# Patient Record
Sex: Male | Born: 1990 | Race: White | Hispanic: No | Marital: Single | Smoking: Former smoker
Health system: Southern US, Community
[De-identification: ages and names within clinical notes are randomized; demographics above are authoritative.]

## PROBLEM LIST (undated history)

## (undated) DIAGNOSIS — J45909 Unspecified asthma, uncomplicated: Secondary | ICD-10-CM

## (undated) DIAGNOSIS — L309 Dermatitis, unspecified: Secondary | ICD-10-CM

---
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
## 2011-03-06 ENCOUNTER — Emergency Department (HOSPITAL_COMMUNITY)
Admission: EM | Admit: 2011-03-06 | Discharge: 2011-03-06 | Disposition: A | Discharge status: Home / Self Care | Payer: 59 | Attending: Emergency Medicine | Admitting: Emergency Medicine

## 2011-03-06 ENCOUNTER — Encounter: Payer: Self-pay | Admitting: *Deleted

## 2011-03-06 DIAGNOSIS — J45901 Unspecified asthma with (acute) exacerbation: Secondary | ICD-10-CM

## 2011-03-06 DIAGNOSIS — J45909 Unspecified asthma, uncomplicated: Secondary | ICD-10-CM | POA: Insufficient documentation

## 2011-03-06 DIAGNOSIS — R0602 Shortness of breath: Secondary | ICD-10-CM | POA: Insufficient documentation

## 2011-03-06 HISTORY — DX: Dermatitis, unspecified: L30.9

## 2011-03-06 MED ORDER — ALBUTEROL SULFATE (5 MG/ML) 0.5% IN NEBU
5.0000 mg | INHALATION_SOLUTION | Freq: Once | RESPIRATORY_TRACT | Status: AC
  Administered 2011-03-06: 5 mg via RESPIRATORY_TRACT
  Filled 2011-03-06: qty 1

## 2011-03-06 MED ORDER — ALBUTEROL SULFATE HFA 108 (90 BASE) MCG/ACT IN AERS
1.0000 | INHALATION_SPRAY | Freq: Once | RESPIRATORY_TRACT | Status: AC
  Administered 2011-03-06: 1 via RESPIRATORY_TRACT
  Filled 2011-03-06: qty 6.7

## 2011-03-06 MED ORDER — IPRATROPIUM BROMIDE 0.02 % IN SOLN
0.5000 mg | Freq: Once | RESPIRATORY_TRACT | Status: AC
  Administered 2011-03-06: 0.5 mg via RESPIRATORY_TRACT
  Filled 2011-03-06: qty 2.5

## 2011-03-06 MED ORDER — FLUTICASONE PROPIONATE HFA 44 MCG/ACT IN AERO
2.0000 | INHALATION_SPRAY | Freq: Once | RESPIRATORY_TRACT | Status: AC
  Administered 2011-03-06: 2 via RESPIRATORY_TRACT
  Filled 2011-03-06: qty 10.6

## 2011-03-06 NOTE — ED Provider Notes (Signed)
History     CSN: [id number] Arrival date & time: 03/06/2011  3:14 AM   First MD Initiated Contact with Patient 03/06/11 0448      Chief Complaint  Patient presents with  . Asthma  . Wheezing  . Shortness of Breath    (Consider location/radiation/quality/duration/timing/severity/associated sxs/prior treatment) HPI Comments: Patient reports that shortly before arrival to [facility] he was woken with a sudden asthma attack. He denies any recent fevers, chills, URI symptoms. Vision is had an asthma history since he was about 20 years old. Patient reports that he has run low on his albuterol inhaler and is likelyoutL. He has not had a chronic care physician. Patient reports that he used to be on maintenance medication such as an inhaled steroid when he was a child but has not been on one in many years. Patient reports he quit smoking several months ago. Patient was audibly wheezing according to the patient's nurse in triage and was given a albuterol nebulizer treatment by protocol. Patient reports that he feels completely resolved at this time. He reports he never had any chest pain, abdominal pain, nausea or vomiting.  Patient is a 20 y.o. male presenting with asthma, wheezing, and shortness of breath. The history is provided by the patient.  Asthma Associated symptoms include shortness of breath.  Wheezing  Associated symptoms include shortness of breath and wheezing. Pertinent negatives include no rhinorrhea and no cough. His past medical history is significant for asthma.  Shortness of Breath  Associated symptoms include shortness of breath and wheezing. Pertinent negatives include no rhinorrhea and no cough. His past medical history is significant for asthma.    Past Medical History  Diagnosis Date  . Asthma   . Eczema     History reviewed. No pertinent past surgical history.  History reviewed. No pertinent family history.  History  Substance Use Topics  . Smoking  status: Former Smoker    Quit date: 12/04/2010  . Smokeless tobacco: Not on file  . Alcohol Use: 3.6 oz/week    6 Cans of beer per week     daily      Review of Systems  Constitutional: Negative.   HENT: Negative for congestion, rhinorrhea and postnasal drip.   Respiratory: Positive for shortness of breath and wheezing. Negative for cough and chest tightness.   Musculoskeletal: Negative.     Allergies  Review of patient's allergies indicates no known allergies.  Home Medications   Current Outpatient Rx  Name Route Sig Dispense Refill  . ALBUTEROL SULFATE HFA 108 (90 BASE) MCG/ACT IN AERS Inhalation Inhale 2 puffs into the lungs every 6 (six) hours as needed.        BP 104/67  Pulse 74  Temp(Src) 97.9 F (36.6 C) (Oral)  Resp 18  SpO2 97%  Physical Exam  Nursing note and vitals reviewed. Constitutional: He is oriented to person, place, and time. He appears well-developed and well-nourished.  HENT:  Head: Normocephalic and atraumatic.  Neck: Normal range of motion. Neck supple.  Cardiovascular: Normal rate and regular rhythm.   Pulmonary/Chest: Effort normal and breath sounds normal. He has no wheezes. He has no rales.  Abdominal: Soft.  Musculoskeletal: Normal range of motion.  Neurological: He is alert and oriented to person, place, and time.  Skin: Skin is warm.    ED Course  Procedures (including critical care time)  Labs Reviewed - No data to display No results found.   1. Asthma attack  MDM  Patient's symptoms are completely resolved. After discussion we have decided to give the patient an albuterol rescue inhaler along with a spacer. I also decided to start him on an inhaled steroid, Flovent. I do not feel that he would benefit from oral steroids given his attack was relatively mild having resolved with one treatment. Patient is a reliable patient. He does know that I recommend that he obtain a primary care physician for routine continued care of  asthma. His room air sats currently are 97% which is normal. His other vital signs are completely normal.        Gabriel Thornton. Oletta Lamas, MD 03/06/11 [phone]

## 2011-03-06 NOTE — Discharge Instructions (Signed)
Asthma Attack Prevention HOW CAN ASTHMA BE PREVENTED? Currently, there is no way to prevent asthma from starting. However, you can take steps to control the disease and prevent its symptoms after you have been diagnosed. Learn about your asthma and how to control it. Take an active role to control your asthma by working with your caregiver to create and follow an asthma action plan. An asthma action plan guides you in taking your medicines properly, avoiding factors that make your asthma worse, tracking your level of asthma control, responding to worsening asthma, and seeking emergency care when needed. To track your asthma, keep records of your symptoms, check your peak flow number using a peak flow meter (handheld device that shows how well air moves out of your lungs), and get regular asthma checkups.  Other ways to prevent asthma attacks include:  Use medicines as your caregiver directs.   Identify and avoid things that make your asthma worse (as much as you can).   Keep track of your asthma symptoms and level of control.   Get regular checkups for your asthma.   With your caregiver, write a detailed plan for taking medicines and managing an asthma attack. Then be sure to follow your action plan. Asthma is an ongoing condition that needs regular monitoring and treatment.   Identify and avoid asthma triggers. A number of outdoor allergens and irritants (pollen, mold, cold air, air pollution) can trigger asthma attacks. Find out what causes or makes your asthma worse, and take steps to avoid those triggers (see below).   Monitor your breathing. Learn to recognize warning signs of an attack, such as slight coughing, wheezing or shortness of breath. However, your lung function may already decrease before you notice any signs or symptoms, so regularly measure and record your peak airflow with a home peak flow meter.   Identify and treat attacks early. If you act quickly, you're less likely to have  a severe attack. You will also need less medicine to control your symptoms. When your peak flow measurements decrease and alert you to an upcoming attack, take your medicine as instructed, and immediately stop any activity that may have triggered the attack. If your symptoms do not improve, get medical help.   Pay attention to increasing quick-relief inhaler use. If you find yourself relying on your quick-relief inhaler (such as albuterol), your asthma is not under control. See your caregiver about adjusting your treatment.  IDENTIFY AND CONTROL FACTORS THAT MAKE YOUR ASTHMA WORSE A number of common things can set off or make your asthma symptoms worse (asthma triggers). Keep track of your asthma symptoms for several weeks, detailing all the environmental and emotional factors that are linked with your asthma. When you have an asthma attack, go back to your asthma diary to see which factor, or combination of factors, might have contributed to it. Once you know what these factors are, you can take steps to control many of them.  Allergies: If you have allergies and asthma, it is important to take asthma prevention steps at home. Asthma attacks (worsening of asthma symptoms) can be triggered by allergies, which can cause temporary increased inflammation of your airways. Minimizing contact with the substance to which you are allergic will help prevent an asthma attack. Animal Dander:   Some people are allergic to the flakes of skin or dried saliva from animals with fur or feathers. Keep these pets out of your home.   If you can't keep a pet outdoors, keep the   pet out of your bedroom and other sleeping areas at all times, and keep the door closed.   Remove carpets and furniture covered with cloth from your home. If that is not possible, keep the pet away from fabric-covered furniture and carpets.  Dust Mites:  Many people with asthma are allergic to dust mites. Dust mites are tiny bugs that are found in  every home, in mattresses, pillows, carpets, fabric-covered furniture, bedcovers, clothes, stuffed toys, fabric, and other fabric-covered items.   Cover your mattress in a special dust-proof cover.   Cover your pillow in a special dust-proof cover, or wash the pillow each week in hot water. Water must be hotter than 130 F to kill dust mites. Cold or warm water used with detergent and bleach can also be effective.   Wash the sheets and blankets on your bed each week in hot water.   Try not to sleep or lie on cloth-covered cushions.   Call ahead when traveling and ask for a smoke-free hotel room. Bring your own bedding and pillows, in case the hotel only supplies feather pillows and down comforters, which may contain dust mites and cause asthma symptoms.   Remove carpets from your bedroom and those laid on concrete, if you can.   Keep stuffed toys out of the bed, or wash the toys weekly in hot water or cooler water with detergent and bleach.  Cockroaches:  Many people with asthma are allergic to the droppings and remains of cockroaches.   Keep food and garbage in closed containers. Never leave food out.   Use poison baits, traps, powders, gels, or paste (for example, boric acid).   If a spray is used to kill cockroaches, stay out of the room until the odor goes away.  Indoor Mold:  Fix leaky faucets, pipes, or other sources of water that have mold around them.   Clean moldy surfaces with a cleaner that has bleach in it.  Pollen and Outdoor Mold:  When pollen or mold spore counts are high, try to keep your windows closed.   Stay indoors with windows closed from late morning to afternoon, if you can. Pollen and some mold spore counts are highest at that time.   Ask your caregiver whether you need to take or increase anti-inflammatory medicine before your allergy season starts.  Irritants:   Tobacco smoke is an irritant. If you smoke, ask your caregiver how you can quit. Ask family  members to quit smoking, too. Do not allow smoking in your home or car.   If possible, do not use a wood-burning stove, kerosene heater, or fireplace. Minimize exposure to all sources of smoke, including incense, candles, fires, and fireworks.   Try to stay away from strong odors and sprays, such as perfume, talcum powder, hair spray, and paints.   Decrease humidity in your home and use an indoor air cleaning device. Reduce indoor humidity to below 60 percent. Dehumidifiers or central air conditioners can do this.   Try to have someone else vacuum for you once or twice a week, if you can. Stay out of rooms while they are being vacuumed and for a short while afterward.   If you vacuum, use a dust mask from a hardware store, a double-layered or microfilter vacuum cleaner bag, or a vacuum cleaner with a HEPA filter.   Sulfites in foods and beverages can be irritants. Do not drink beer or wine, or eat dried fruit, processed potatoes, or shrimp if they cause asthma   symptoms.   Cold air can trigger an asthma attack. Cover your nose and mouth with a scarf on cold or windy days.   Several health conditions can make asthma more difficult to manage, including runny nose, sinus infections, reflux disease, psychological stress, and sleep apnea. Your caregiver will treat these conditions, as well.   Avoid close contact with people who have a cold or the flu, since your asthma symptoms may get worse if you catch the infection from them. Wash your hands thoroughly after touching items that may have been handled by people with a respiratory infection.   Get a flu shot every year to protect against the flu virus, which often makes asthma worse for days or weeks. Also get a pneumonia shot once every five to 10 years.  Drugs:  Aspirin and other painkillers can cause asthma attacks. 10% to 20% of people with asthma have sensitivity to aspirin or a group of painkillers called non-steroidal anti-inflammatory drugs  (NSAIDS), such as ibuprofen and naproxen. These drugs are used to treat pain and reduce fevers. Asthma attacks caused by any of these medicines can be severe and even fatal. These drugs must be avoided in people who have known aspirin sensitive asthma. Products with acetaminophen are considered safe for people who have asthma. It is important that people with aspirin sensitivity read labels of all over-the-counter drugs used to treat pain, colds, coughs, and fever.   Beta blockers and ACE inhibitors are other drugs which you should discuss with your caregiver, in relation to your asthma.  ALLERGY SKIN TESTING  Ask your asthma caregiver about allergy skin testing or blood testing (RAST test) to identify the allergens to which you are sensitive. If you are found to have allergies, allergy shots (immunotherapy) for asthma may help prevent future allergies and asthma. With allergy shots, small doses of allergens (substances to which you are allergic) are injected under your skin on a regular schedule. Over a period of time, your body may become used to the allergen and less responsive with asthma symptoms. You can also take measures to minimize your exposure to those allergens. EXERCISE  If you have exercise-induced asthma, or are planning vigorous exercise, or exercise in cold, humid, or dry environments, prevent exercise-induced asthma by following your caregiver's advice regarding asthma treatment before exercising. Document Released: 03/22/2009 Document Revised: 12/14/2010 Document Reviewed: 03/22/2009 [employer] Patient Information 2012 [employer]. 

## 2011-03-06 NOTE — ED Notes (Signed)
Pt presents w/ audible wheezing, hx of asthma, woke x 1.5 hrs ago w/ shortness of breath.

## 2012-03-31 ENCOUNTER — Emergency Department (HOSPITAL_COMMUNITY)
Admission: EM | Admit: 2012-03-31 | Discharge: 2012-03-31 | Disposition: A | Discharge status: Home / Self Care | Payer: 59 | Attending: Emergency Medicine | Admitting: Emergency Medicine

## 2012-03-31 ENCOUNTER — Emergency Department (HOSPITAL_COMMUNITY): Payer: 59

## 2012-03-31 ENCOUNTER — Encounter (HOSPITAL_COMMUNITY): Payer: Self-pay | Admitting: *Deleted

## 2012-03-31 DIAGNOSIS — R0789 Other chest pain: Secondary | ICD-10-CM | POA: Insufficient documentation

## 2012-03-31 DIAGNOSIS — J45901 Unspecified asthma with (acute) exacerbation: Secondary | ICD-10-CM | POA: Insufficient documentation

## 2012-03-31 DIAGNOSIS — Z79899 Other long term (current) drug therapy: Secondary | ICD-10-CM | POA: Insufficient documentation

## 2012-03-31 DIAGNOSIS — J45909 Unspecified asthma, uncomplicated: Secondary | ICD-10-CM

## 2012-03-31 DIAGNOSIS — J40 Bronchitis, not specified as acute or chronic: Secondary | ICD-10-CM

## 2012-03-31 DIAGNOSIS — R0602 Shortness of breath: Secondary | ICD-10-CM | POA: Insufficient documentation

## 2012-03-31 DIAGNOSIS — R0609 Other forms of dyspnea: Secondary | ICD-10-CM | POA: Insufficient documentation

## 2012-03-31 DIAGNOSIS — R093 Abnormal sputum: Secondary | ICD-10-CM | POA: Insufficient documentation

## 2012-03-31 DIAGNOSIS — J209 Acute bronchitis, unspecified: Secondary | ICD-10-CM | POA: Insufficient documentation

## 2012-03-31 DIAGNOSIS — R0989 Other specified symptoms and signs involving the circulatory and respiratory systems: Secondary | ICD-10-CM | POA: Insufficient documentation

## 2012-03-31 DIAGNOSIS — Z87891 Personal history of nicotine dependence: Secondary | ICD-10-CM | POA: Insufficient documentation

## 2012-03-31 LAB — BASIC METABOLIC PANEL
BUN: 14 mg/dL (ref 6–23)
CO2: 22 mEq/L (ref 19–32)
Calcium: 8.9 mg/dL (ref 8.4–10.5)
Chloride: 105 mEq/L (ref 96–112)
Creatinine, Ser: 0.71 mg/dL (ref 0.50–1.35)

## 2012-03-31 LAB — CBC WITH DIFFERENTIAL/PLATELET
Eosinophils Relative: 2 % (ref 0–5)
HCT: 41 % (ref 39.0–52.0)
Hemoglobin: 14.2 g/dL (ref 13.0–17.0)
Lymphocytes Relative: 10 % — ABNORMAL LOW (ref 12–46)
MCHC: 34.6 g/dL (ref 30.0–36.0)
MCV: 84.5 fL (ref 78.0–100.0)
Monocytes Absolute: 0.1 10*3/uL (ref 0.1–1.0)
Monocytes Relative: 1 % — ABNORMAL LOW (ref 3–12)
Neutro Abs: 5.5 10*3/uL (ref 1.7–7.7)
WBC: 6.2 10*3/uL (ref 4.0–10.5)

## 2012-03-31 MED ORDER — AEROCHAMBER Z-STAT PLUS/MEDIUM MISC: 1.0000 | Freq: Once | Status: DC

## 2012-03-31 MED ORDER — ALBUTEROL SULFATE HFA 108 (90 BASE) MCG/ACT IN AERS
2.0000 | INHALATION_SPRAY | RESPIRATORY_TRACT | Status: DC | PRN
  Administered 2012-03-31: 2 via RESPIRATORY_TRACT
  Filled 2012-03-31: qty 6.7

## 2012-03-31 MED ORDER — PREDNISONE 10 MG PO TABS: 10.0000 mg | ORAL_TABLET | Freq: Once | ORAL | Status: DC

## 2012-03-31 MED ORDER — PREDNISONE 20 MG PO TABS: 60.0000 mg | ORAL_TABLET | Freq: Once | ORAL | Status: DC

## 2012-03-31 MED ORDER — SODIUM CHLORIDE 0.9 % IV SOLN
INTRAVENOUS | Status: DC
  Administered 2012-03-31: 09:00:00 via INTRAVENOUS

## 2012-03-31 MED ORDER — SODIUM CHLORIDE 0.9 % IV BOLUS (SEPSIS)
1000.0000 mL | Freq: Once | INTRAVENOUS | Status: AC
  Administered 2012-03-31: 1000 mL via INTRAVENOUS

## 2012-03-31 MED ORDER — ALBUTEROL SULFATE (5 MG/ML) 0.5% IN NEBU
10.0000 mg | INHALATION_SOLUTION | Freq: Once | RESPIRATORY_TRACT | Status: AC
  Administered 2012-03-31: 10 mg via RESPIRATORY_TRACT

## 2012-03-31 MED ORDER — PREDNISONE 20 MG PO TABS
60.0000 mg | ORAL_TABLET | Freq: Once | ORAL | Status: AC
  Administered 2012-03-31: 60 mg via ORAL
  Filled 2012-03-31: qty 3

## 2012-03-31 MED ORDER — MOMETASONE FURO-FORMOTEROL FUM 200-5 MCG/ACT IN AERO: 2.0000 | INHALATION_SPRAY | Freq: Two times a day (BID) | RESPIRATORY_TRACT | Status: DC

## 2012-03-31 NOTE — ED Notes (Signed)
Patient transported to X-ray 

## 2012-03-31 NOTE — ED Provider Notes (Signed)
History     CSN: [id number]  Arrival date & time 03/31/12  5366   First MD Initiated Contact with Patient 03/31/12 [phone]      Chief Complaint  Patient presents with  . Shortness of Breath  . Asthma  . chest tightness     (Consider location/radiation/quality/duration/timing/severity/associated sxs/prior treatment) HPI Comments: Gabriel Thornton is a 21 y.o. Male who who has had shortness of breath for several weeks. His albuterol inhaler, ran out, last night. He has no regular medical provider. He stopped smoking a couple weeks ago, when the shortness of breath, started. She has cough, productive of green sputum, without blood. No fever, chills, nausea, or vomiting. He is more short of breath with activity, it improves with rest. There are no other modifying factors.  Patient is a 21 y.o. male presenting with shortness of breath and asthma. The history is provided by the patient.  Shortness of Breath  Associated symptoms include shortness of breath. His past medical history is significant for asthma.  Asthma Associated symptoms include shortness of breath.    Past Medical History  Diagnosis Date  . Asthma   . Eczema     History reviewed. No pertinent past surgical history.  History reviewed. No pertinent family history.  History  Substance Use Topics  . Smoking status: Former Smoker -- 1.0 packs/day for 5 years    Types: Cigarettes    Quit date: 12/04/2010  . Smokeless tobacco: Not on file  . Alcohol Use: 3.6 oz/week    6 Cans of beer per week     Comment: daily      Review of Systems  Respiratory: Positive for shortness of breath.   All other systems reviewed and are negative.    Allergies  Review of patient's allergies indicates no known allergies.  Home Medications   Current Outpatient Rx  Name  Route  Sig  Dispense  Refill  . ALBUTEROL SULFATE HFA 108 (90 BASE) MCG/ACT IN AERS   Inhalation   Inhale 2 puffs into the lungs every 6 (six) hours as needed.            Marland Kitchen SINUS PO   Oral   Take 1 tablet by mouth daily as needed. Cold symptoms         . MOMETASONE FURO-FORMOTEROL FUM 200-5 MCG/ACT IN AERO   Inhalation   Inhale 2 puffs into the lungs 2 (two) times daily.   8.8 g   0   . PREDNISONE 10 MG PO TABS   Oral   Take 1 tablet (10 mg total) by mouth once. Take q day 6,5,4,3,2,1   21 tablet   0     BP 117/74  Pulse 111  Temp 98.6 F (37 C) (Oral)  Resp 18  SpO2 93%  Physical Exam  Nursing note and vitals reviewed. Constitutional: He is oriented to person, place, and time. He appears well-developed and well-nourished.  HENT:  Head: Normocephalic and atraumatic.  Right Ear: External ear normal.  Left Ear: External ear normal.  Eyes: Conjunctivae normal and EOM are normal. Pupils are equal, round, and reactive to light.  Neck: Normal range of motion and phonation normal. Neck supple.  Cardiovascular: Normal rate, regular rhythm, normal heart sounds and intact distal pulses.   Pulmonary/Chest: He is in respiratory distress (Moderate). He has wheezes. He has no rales. He exhibits no tenderness and no bony tenderness.       Increased work of breathing, tripod position, intercostal retractions. Decreased air  movement, and generalized wheezing.  Abdominal: Soft. Normal appearance. There is no tenderness.  Musculoskeletal: Normal range of motion.  Neurological: He is alert and oriented to person, place, and time. He has normal strength. No cranial nerve deficit or sensory deficit. He exhibits normal muscle tone. Coordination normal.  Skin: Skin is warm, dry and intact.  Psychiatric: He has a normal mood and affect. His behavior is normal. Judgment and thought content normal.    ED Course  Procedures (including critical care time)  Initial treatment: Continuous albuterol nebulizer, and prednisone, orally. IV fluids, given.  Reevaluation: He feels somewhat better. Lungs have improved air movement. Now, more evident, wheezing.  Heart rate 117. Repeat continuous nebulizer ordered.  Reevaluation: 11:30- he desaturated, on room air; he required oxygen by nasal cannula at 2 L, to improve the oxygen saturation to 93%.   At D/C he was able to ambulate with normal O2 Sat., and mild tachycardia. At rest, no respiratory distress. He felt improved and comfortable with the D/C plan.   Date: 02/02/2012  Rate: 118  Rhythm: sinus tachycardia  QRS Axis: normal  PR and QT Intervals: normal  ST/T Wave abnormalities: nonspecific ST changes  PR and QRS Conduction Disutrbances:none  Narrative Interpretation: possible biatrial enlargement  Old EKG Reviewed: none available  CRITICAL CARE Performed by: Mancel Bale L   Total critical care time: 50 minutes  Critical care time was exclusive of separately billable procedures and treating other patients.  Critical care was necessary to treat or prevent imminent or life-threatening deterioration.  Critical care was time spent personally by me on the following activities: development of treatment plan with patient and/or surrogate as well as nursing, discussions with consultants, evaluation of patient's response to treatment, examination of patient, obtaining history from patient or surrogate, ordering and performing treatments and interventions, ordering and review of laboratory studies, ordering and review of radiographic studies, pulse oximetry and re-evaluation of patient's condition.  Labs Reviewed  CBC WITH DIFFERENTIAL - Abnormal; Notable for the following:    Neutrophils Relative 88 (*)     Lymphocytes Relative 10 (*)     Lymphs Abs 0.6 (*)     Monocytes Relative 1 (*)     All other components within normal limits  BASIC METABOLIC PANEL - Abnormal; Notable for the following:    Glucose, Bld 105 (*)     All other components within normal limits  LAB REPORT - SCANNED   Dg Chest 2 View  03/31/2012  *RADIOLOGY REPORT*  Clinical Data: Short of breath, chest tightness   CHEST - 2 VIEW  Comparison: None.  Findings: Normal mediastinum and cardiac silhouette.  Normal pulmonary  vasculature.  No evidence of effusion, infiltrate, or pneumothorax.  No acute bony abnormality.  IMPRESSION: Normal chest radiograph   Original Report Authenticated By: Genevive Bi, M.D.      1. Asthma   2. Bronchitis       MDM  Asthma with under-treatment at home, complicated by tobacco abuse (recently stopped). Improved with ED treatment. Stable for continued treatment at home. Doubt metabolic instability, serious bacterial infection or impending vascular collapse; the patient is stable for discharge.   Plan: Home Medications- Albuterol, Prednisone and Dulera; Home Treatments- avoid tobacco; Recommended follow up- PCP of choice asap      Flint Melter, MD 04/01/12 1316

## 2012-03-31 NOTE — ED Notes (Signed)
Pt more comfortable. CN helping pt to breathe easier. Pt reports "feeling much better".

## 2012-03-31 NOTE — ED Notes (Signed)
Respiratory called

## 2012-03-31 NOTE — ED Notes (Addendum)
Pt ambulated in the hall O2 sats began at 95% and heart rate 119. During ambulation O2 sats increased to 97% and heart rate of 136. At the end of ambulation the patient began coughing and having increased shortness of breath but sustained 97% oxygen saturation.

## 2012-03-31 NOTE — ED Notes (Addendum)
Pt reports hx of asthma, pt has had upper respiratory infection x1 week.productive cough, "little chunks of blood and dark colored phlem"  In last week pt has used up Ventilin, inhaler (30 day supply) in last week because of asthma. Pt reports this flare up started 0300. At present pt has inspiratory and expiratory wheezes heard in all fields. Pt able to speak in full sentences. In tripod position. 90% room air. 94% 2L [place]. Pt also reports chest tightness started at same time as asthma attack. Pain 7/10. Headache present as well.

## 2012-03-31 NOTE — ED Notes (Addendum)
Pt desat to 88% while sleeping. Placed pt on 2L, notified Effie Shy, EDP. Pt now 93%

## 2012-03-31 NOTE — ED Notes (Signed)
Respiratory called for inhaler, chamber and pt education

## 2012-12-11 ENCOUNTER — Encounter (HOSPITAL_COMMUNITY): Payer: Self-pay | Admitting: *Deleted

## 2012-12-11 ENCOUNTER — Emergency Department (HOSPITAL_COMMUNITY)
Admission: EM | Admit: 2012-12-11 | Discharge: 2012-12-11 | Disposition: A | Discharge status: Home / Self Care | Payer: Self-pay | Attending: Emergency Medicine | Admitting: Emergency Medicine

## 2012-12-11 DIAGNOSIS — Z87891 Personal history of nicotine dependence: Secondary | ICD-10-CM | POA: Insufficient documentation

## 2012-12-11 DIAGNOSIS — J45901 Unspecified asthma with (acute) exacerbation: Secondary | ICD-10-CM | POA: Insufficient documentation

## 2012-12-11 HISTORY — DX: Unspecified asthma, uncomplicated: J45.909

## 2012-12-11 MED ORDER — IPRATROPIUM BROMIDE 0.02 % IN SOLN
0.5000 mg | Freq: Once | RESPIRATORY_TRACT | Status: AC
  Administered 2012-12-11: 0.5 mg via RESPIRATORY_TRACT
  Filled 2012-12-11: qty 2.5

## 2012-12-11 MED ORDER — ALBUTEROL SULFATE (5 MG/ML) 0.5% IN NEBU
2.5000 mg | INHALATION_SOLUTION | Freq: Once | RESPIRATORY_TRACT | Status: AC
  Administered 2012-12-11: 2.5 mg via RESPIRATORY_TRACT
  Filled 2012-12-11: qty 0.5

## 2012-12-11 MED ORDER — ALBUTEROL SULFATE HFA 108 (90 BASE) MCG/ACT IN AERS
2.0000 | INHALATION_SPRAY | Freq: Once | RESPIRATORY_TRACT | Status: DC
  Filled 2012-12-11: qty 6.7

## 2012-12-11 NOTE — ED Notes (Signed)
Patient is alert and oriented x3.  He is complaining of shortness of breath that started yesterday about 5pm. Patient has a rescue inhaler but used it up with no relief.  He does have a history of this issue

## 2012-12-11 NOTE — ED Provider Notes (Signed)
CSN: [id number]     Arrival date & time 12/11/12  0547 History   First MD Initiated Contact with Patient 12/11/12 0602     Chief Complaint  Patient presents with  . Shortness of Breath   (Consider location/radiation/quality/duration/timing/severity/associated sxs/prior Treatment) HPI Comments: 22 year old male with a past medical history of asthma presents to the emergency department complaining of shortness of breath beginning about 5 PM yesterday after working outside under a house. States he was wheezing, took his and inhaler with minimal relief. He is out of his inhaler at this time. Upon arrival to the emergency department he received a nebulizer treatment and states his symptoms "are completely gone". Denies recent illness, cough, fever or chills. States he has asthma exacerbations every 6 months, however not severe. He does not have a primary care physician at this time.  The history is provided by the patient.    Past Medical History  Diagnosis Date  . Asthma    History reviewed. No pertinent past surgical history. History reviewed. No pertinent family history. History  Substance Use Topics  . Smoking status: Former Games developer  . Smokeless tobacco: Not on file  . Alcohol Use: Yes    Review of Systems  Constitutional: Negative for fever and chills.  Respiratory: Positive for shortness of breath and wheezing. Negative for cough.   Cardiovascular: Negative for chest pain.  All other systems reviewed and are negative.    Allergies  Review of patient's allergies indicates no known allergies.  Home Medications  No current outpatient prescriptions on file. BP 135/72  Pulse 96  Temp(Src) 97.6 F (36.4 C) (Oral)  SpO2 100% Physical Exam  Nursing note and vitals reviewed. Constitutional: He is oriented to person, place, and time. He appears well-developed and well-nourished. No distress.  HENT:  Head: Normocephalic and atraumatic.  Mouth/Throat: Oropharynx is clear and  moist.  Eyes: Conjunctivae are normal.  Neck: Normal range of motion. Neck supple. No tracheal deviation present.  Cardiovascular: Normal rate, regular rhythm and normal heart sounds.   Pulmonary/Chest: Effort normal and breath sounds normal. No respiratory distress. He has no wheezes. He has no rhonchi. He has no rales.  Musculoskeletal: Normal range of motion. He exhibits no edema.  Neurological: He is alert and oriented to person, place, and time.  Skin: Skin is warm and dry. He is not diaphoretic.  Psychiatric: He has a normal mood and affect. His behavior is normal.    ED Course  Procedures (including critical care time) Labs Review Labs Reviewed - No data to display Imaging Review No results found.  MDM   1. Asthma exacerbation, mild    Patient with mild asthma exacerbation. Symptoms resolved after nebulizer treatment in the emergency department. He is out of his Ventolin inhaler, will prescribe a new inhaler. Lungs clear to auscultation bilateral. He is in no apparent distress. O2 sat 100% on room air. He is not tachycardic or tachypneic. He is stable for discharge. Close return precautions discussed. Resources given for PCP followup. Patient states understanding of plan and is agreeable.    Trevor Mace, PA-C 12/11/12 [phone]

## 2012-12-11 NOTE — ED Notes (Signed)
Patient is alert and oriented x3.  He was given DC instructions and follow up visit instructions.  Patient gave verbal understanding.  He was DC ambulatory under his own power to home.  V/S stable.  He was not showing any signs of distress on DC 

## 2012-12-11 NOTE — ED Provider Notes (Signed)
Medical screening examination/treatment/procedure(s) were performed by non-physician practitioner and as supervising physician I was immediately available for consultation/collaboration.  Geoffery Lyons, MD 12/11/12 [phone]

## 2012-12-26 ENCOUNTER — Encounter (HOSPITAL_COMMUNITY): Payer: Self-pay

## 2012-12-26 ENCOUNTER — Emergency Department (HOSPITAL_COMMUNITY)
Admission: EM | Admit: 2012-12-26 | Discharge: 2012-12-26 | Disposition: A | Discharge status: Home / Self Care | Payer: Self-pay | Attending: Emergency Medicine | Admitting: Emergency Medicine

## 2012-12-26 ENCOUNTER — Emergency Department (HOSPITAL_COMMUNITY): Payer: 59

## 2012-12-26 DIAGNOSIS — Z79899 Other long term (current) drug therapy: Secondary | ICD-10-CM | POA: Insufficient documentation

## 2012-12-26 DIAGNOSIS — Z872 Personal history of diseases of the skin and subcutaneous tissue: Secondary | ICD-10-CM | POA: Insufficient documentation

## 2012-12-26 DIAGNOSIS — Z87891 Personal history of nicotine dependence: Secondary | ICD-10-CM | POA: Insufficient documentation

## 2012-12-26 DIAGNOSIS — J45901 Unspecified asthma with (acute) exacerbation: Secondary | ICD-10-CM | POA: Insufficient documentation

## 2012-12-26 MED ORDER — ALBUTEROL SULFATE (5 MG/ML) 0.5% IN NEBU
INHALATION_SOLUTION | RESPIRATORY_TRACT | Status: AC
  Filled 2012-12-26: qty 1

## 2012-12-26 MED ORDER — PREDNISONE 20 MG PO TABS: 60.0000 mg | ORAL_TABLET | Freq: Every day | ORAL | Status: DC

## 2012-12-26 MED ORDER — METHYLPREDNISOLONE SODIUM SUCC 125 MG IJ SOLR
125.0000 mg | Freq: Once | INTRAMUSCULAR | Status: AC
  Administered 2012-12-26: 125 mg via INTRAVENOUS
  Filled 2012-12-26: qty 2

## 2012-12-26 MED ORDER — IPRATROPIUM BROMIDE 0.02 % IN SOLN
0.5000 mg | Freq: Once | RESPIRATORY_TRACT | Status: AC
  Administered 2012-12-26: 0.5 mg via RESPIRATORY_TRACT
  Filled 2012-12-26: qty 2.5

## 2012-12-26 MED ORDER — ALBUTEROL SULFATE HFA 108 (90 BASE) MCG/ACT IN AERS
2.0000 | INHALATION_SPRAY | RESPIRATORY_TRACT | Status: DC | PRN
  Administered 2012-12-26: 2 via RESPIRATORY_TRACT
  Filled 2012-12-26: qty 6.7

## 2012-12-26 MED ORDER — ALBUTEROL SULFATE (5 MG/ML) 0.5% IN NEBU
5.0000 mg | INHALATION_SOLUTION | Freq: Once | RESPIRATORY_TRACT | Status: AC
  Administered 2012-12-26: 5 mg via RESPIRATORY_TRACT
  Filled 2012-12-26: qty 1

## 2012-12-26 MED ORDER — ALBUTEROL SULFATE (5 MG/ML) 0.5% IN NEBU
5.0000 mg | INHALATION_SOLUTION | Freq: Once | RESPIRATORY_TRACT | Status: AC
  Administered 2012-12-26: 5 mg via RESPIRATORY_TRACT

## 2012-12-26 MED ORDER — IPRATROPIUM BROMIDE 0.02 % IN SOLN
0.5000 mg | Freq: Once | RESPIRATORY_TRACT | Status: AC
  Administered 2012-12-26: 0.5 mg via RESPIRATORY_TRACT

## 2012-12-26 MED ORDER — IPRATROPIUM BROMIDE 0.02 % IN SOLN
RESPIRATORY_TRACT | Status: AC
  Filled 2012-12-26: qty 2.5

## 2012-12-26 MED ORDER — GUAIFENESIN ER 600 MG PO TB12: 1200.0000 mg | ORAL_TABLET | Freq: Two times a day (BID) | ORAL | Status: DC

## 2012-12-26 NOTE — ED Notes (Signed)
Per pt, shortness of breath since aout 10/11pm.  Cough with sputum for 2 weeks.  Has inhaler and it is not working.

## 2012-12-26 NOTE — ED Provider Notes (Signed)
CSN: [id number]     Arrival date & time 12/26/12  0308 History   First MD Initiated Contact with Patient 12/26/12 [phone]     Chief Complaint  Patient presents with  . Shortness of Breath   (Consider location/radiation/quality/duration/timing/severity/associated sxs/prior Treatment) HPI 22 year old male presents to emergency room with onset of wheezing tonight at 11 PM.  He, reports he's been using an inhaler since that time without improvement.  He, reports he's had cough, productive of brownish green sputum.  Over the last several weeks, but actually has been better over the last few days.  No fever no chills.  Patient reports he usually has an exacerbation every 6 months.  He does not have a primary care Dr.  He, reports he's not smoking.  Past Medical History  Diagnosis Date  . Asthma   . Eczema   . Asthma    History reviewed. No pertinent past surgical history. History reviewed. No pertinent family history. History  Substance Use Topics  . Smoking status: Former Smoker -- 1.00 packs/day for 5 years    Types: Cigarettes    Quit date: 12/04/2010  . Smokeless tobacco: Not on file  . Alcohol Use: 3.6 oz/week    6 Cans of beer per week     Comment: daily    Review of Systems  All other systems reviewed and are negative.    Allergies  Review of patient's allergies indicates no known allergies.  Home Medications   Current Outpatient Rx  Name  Route  Sig  Dispense  Refill  . albuterol (PROVENTIL HFA;VENTOLIN HFA) 108 (90 BASE) MCG/ACT inhaler   Inhalation   Inhale 2 puffs into the lungs every 6 (six) hours as needed for wheezing.         . mometasone-formoterol (DULERA) 200-5 MCG/ACT AERO   Inhalation   Inhale 2 puffs into the lungs 2 (two) times daily.   8.8 g   0    BP 131/83  Pulse 88  Temp(Src) 97.6 F (36.4 C) (Oral)  Resp 16  Ht 6\' 1"  (1.854 m)  Wt 195 lb (88.451 kg)  BMI 25.73 kg/m2  SpO2 94% Physical Exam  Nursing note and vitals  reviewed. Constitutional: He is oriented to person, place, and time. He appears well-developed and well-nourished.  HENT:  Head: Normocephalic and atraumatic.  Nose: Nose normal.  Mouth/Throat: Oropharynx is clear and moist.  Eyes: Conjunctivae and EOM are normal. Pupils are equal, round, and reactive to light.  Neck: Normal range of motion. Neck supple. No JVD present. No tracheal deviation present. No thyromegaly present.  Cardiovascular: Normal rate, regular rhythm, normal heart sounds and intact distal pulses.  Exam reveals no gallop and no friction rub.   No murmur heard. Pulmonary/Chest: No stridor. He is in respiratory distress. He has wheezes. He has no rales. He exhibits no tenderness.  Moderate respiratory distress with accessory muscle use, prolonged expiratory phase, wheezing in all fields and tachypnea  Abdominal: Soft. Bowel sounds are normal. He exhibits no distension and no mass. There is no tenderness. There is no rebound and no guarding.  Musculoskeletal: Normal range of motion. He exhibits no edema and no tenderness.  Lymphadenopathy:    He has no cervical adenopathy.  Neurological: He is alert and oriented to person, place, and time. He exhibits normal muscle tone. Coordination normal.  Skin: Skin is warm and dry. No rash noted. No erythema. No pallor.  Psychiatric: He has a normal mood and affect. His behavior is normal.  Judgment and thought content normal.    ED Course  Procedures (including critical care time) Labs Review Labs Reviewed - No data to display Imaging Review Dg Chest [facility] 1 View  12/26/2012   *RADIOLOGY REPORT*  Clinical Data: Shortness of breath.  Asthma.  PORTABLE CHEST - 1 VIEW  Comparison: None.  Findings: Calcification projected over the distal trachea probably represents a calcified lymph node.  Pulmonary hyperinflation. Heart size and pulmonary vascularity are normal.  No focal airspace disease or consolidation in the lungs.  No blunting of  costophrenic angles.  No pneumothorax.  Mediastinal contours appear intact.  IMPRESSION: Probable large calcified lymph node in the distal tracheal region. Mild hyperinflation.  No evidence of active pulmonary disease.   Original Report Authenticated By: Burman Nieves, M.D.    MDM   1. Asthma exacerbation    22 year old male with asthma exacerbation.  Patient has been given one neb treatment, and Solu-Medrol.  Patient had clearing of all wheezing, but after an hour.  Patient rechecked, and wheezing.  Has returned.  We'll give him a second treatment, and plan for discharge with continued steroids for the next 5 days, albuterol as needed and close followup with her primary care Dr.  Maryclare Labrador give him resource list    Olivia Mackie, MD 12/26/12 [phone]

## 2013-03-18 ENCOUNTER — Encounter (HOSPITAL_COMMUNITY): Payer: Self-pay | Admitting: Emergency Medicine

## 2013-03-18 ENCOUNTER — Emergency Department (HOSPITAL_COMMUNITY)
Admission: EM | Admit: 2013-03-18 | Discharge: 2013-03-18 | Disposition: A | Discharge status: Home / Self Care | Payer: 59 | Attending: Emergency Medicine | Admitting: Emergency Medicine

## 2013-03-18 DIAGNOSIS — Z79899 Other long term (current) drug therapy: Secondary | ICD-10-CM | POA: Insufficient documentation

## 2013-03-18 DIAGNOSIS — J45901 Unspecified asthma with (acute) exacerbation: Secondary | ICD-10-CM | POA: Insufficient documentation

## 2013-03-18 DIAGNOSIS — IMO0002 Reserved for concepts with insufficient information to code with codable children: Secondary | ICD-10-CM | POA: Insufficient documentation

## 2013-03-18 DIAGNOSIS — Z87891 Personal history of nicotine dependence: Secondary | ICD-10-CM | POA: Insufficient documentation

## 2013-03-18 DIAGNOSIS — Z872 Personal history of diseases of the skin and subcutaneous tissue: Secondary | ICD-10-CM | POA: Insufficient documentation

## 2013-03-18 MED ORDER — PREDNISONE 10 MG PO TABS: 20.0000 mg | ORAL_TABLET | Freq: Every day | ORAL | Status: DC

## 2013-03-18 MED ORDER — ALBUTEROL (5 MG/ML) CONTINUOUS INHALATION SOLN
10.0000 mg/h | INHALATION_SOLUTION | Freq: Once | RESPIRATORY_TRACT | Status: AC
  Administered 2013-03-18: 10 mg/h via RESPIRATORY_TRACT
  Filled 2013-03-18: qty 40

## 2013-03-18 MED ORDER — PREDNISONE 20 MG PO TABS
60.0000 mg | ORAL_TABLET | Freq: Once | ORAL | Status: AC
  Administered 2013-03-18: 60 mg via ORAL
  Filled 2013-03-18: qty 3

## 2013-03-18 MED ORDER — IPRATROPIUM BROMIDE 0.02 % IN SOLN
RESPIRATORY_TRACT | Status: AC
  Administered 2013-03-18: 0.5 mg
  Filled 2013-03-18: qty 2.5

## 2013-03-18 MED ORDER — FLUTICASONE PROPIONATE HFA 220 MCG/ACT IN AERO: 1.0000 | INHALATION_SPRAY | Freq: Two times a day (BID) | RESPIRATORY_TRACT | Status: AC

## 2013-03-18 MED ORDER — ALBUTEROL SULFATE (5 MG/ML) 0.5% IN NEBU
INHALATION_SOLUTION | RESPIRATORY_TRACT | Status: AC
  Administered 2013-03-18: 5 mg
  Filled 2013-03-18: qty 1

## 2013-03-18 MED ORDER — ALBUTEROL SULFATE HFA 108 (90 BASE) MCG/ACT IN AERS: 2.0000 | INHALATION_SPRAY | RESPIRATORY_TRACT | Status: AC | PRN

## 2013-03-18 NOTE — ED Notes (Signed)
Pt reports he has been having an asthma attack for the past 3 hours gradually progressing. Pt reports back pain. States he has been out of his rescue inhaler for the past 3 weeks. Pt noted to have expiratory wheezing throughout at this time

## 2013-03-18 NOTE — ED Notes (Signed)
RT at bedside at this time.

## 2013-03-18 NOTE — ED Notes (Addendum)
Discharged after continuous neb treatment, per Dr. Fayrene Fearing.

## 2013-03-18 NOTE — ED Provider Notes (Signed)
CSN: [id number]     Arrival date & time 03/18/13  2207 History   First MD Initiated Contact with Patient 03/18/13 2209     Chief Complaint  Patient presents with  . Asthma  . Shortness of Breath    HPI  Patient presents with chief complaint as exacerbation of shortness of breath. Has history of asthma. He simply on albuterol home. For a few weeks. He is not chronically on prednisone completed a course 2 weeks ago. He is not on any maintenance inhalers. For breath for the last few days or so at home tonight he presents here.  He takes Claritin daily and states it was since he started taking his medicine unless comment. He was on Flovent before which worked well, however he is unable to afford it now.  Past Medical History  Diagnosis Date  . Asthma   . Eczema   . Asthma    History reviewed. No pertinent past surgical history. History reviewed. No pertinent family history. History  Substance Use Topics  . Smoking status: Former Smoker -- 1.00 packs/day for 5 years    Types: Cigarettes    Quit date: 12/04/2010  . Smokeless tobacco: Not on file  . Alcohol Use: 3.6 oz/week    6 Cans of beer per week     Comment: daily    Review of Systems  Constitutional: Negative for fever, chills, diaphoresis, appetite change and fatigue.  HENT: Negative for mouth sores, sore throat and trouble swallowing.   Eyes: Negative for visual disturbance.  Respiratory: Positive for shortness of breath and wheezing. Negative for cough and chest tightness.   Cardiovascular: Negative for chest pain.  Gastrointestinal: Negative for nausea, vomiting, abdominal pain, diarrhea and abdominal distention.  Endocrine: Negative for polydipsia, polyphagia and polyuria.  Genitourinary: Negative for dysuria, frequency and hematuria.  Musculoskeletal: Negative for gait problem.  Skin: Negative for color change, pallor and rash.  Neurological: Negative for dizziness, syncope, light-headedness and headaches.   Hematological: Does not bruise/bleed easily.  Psychiatric/Behavioral: Negative for behavioral problems and confusion.    Allergies  Review of patient's allergies indicates no known allergies.  Home Medications   Current Outpatient Rx  Name  Route  Sig  Dispense  Refill  . albuterol (PROVENTIL HFA;VENTOLIN HFA) 108 (90 BASE) MCG/ACT inhaler   Inhalation   Inhale 2 puffs into the lungs every 6 (six) hours as needed for wheezing.         Marland Kitchen loratadine (CLARITIN) 10 MG tablet   Oral   Take 10 mg by mouth daily.         Marland Kitchen albuterol (PROVENTIL HFA;VENTOLIN HFA) 108 (90 BASE) MCG/ACT inhaler   Inhalation   Inhale 2 puffs into the lungs every 4 (four) hours as needed for wheezing or shortness of breath.   1 Inhaler   12   . fluticasone (FLOVENT HFA) 220 MCG/ACT inhaler   Inhalation   Inhale 1 puff into the lungs 2 (two) times daily.   1 Inhaler   12   . predniSONE (DELTASONE) 10 MG tablet   Oral   Take 2 tablets (20 mg total) by mouth daily.   10 tablet   0    BP 161/95  Pulse 80  Temp(Src) 97.9 F (36.6 C) (Oral)  Resp 24  SpO2 95% Physical Exam  Constitutional: He is oriented to person, place, and time. He appears well-developed and well-nourished. No distress.  HENT:  Head: Normocephalic.  Eyes: Conjunctivae are normal. Pupils are equal, round,  and reactive to light. No scleral icterus.  Neck: Normal range of motion. Neck supple. No thyromegaly present.  Cardiovascular: Normal rate and regular rhythm.  Exam reveals no gallop and no friction rub.   No murmur heard. Pulmonary/Chest: Effort normal. No respiratory distress. He has wheezes in the right upper field, the right middle field, the right lower field, the left upper field, the left middle field and the left lower field. He has no rales.  Bowel prolongation and some retractions. He is speaking 2-3 words at a time.  Abdominal: Soft. Bowel sounds are normal. He exhibits no distension. There is no tenderness.  There is no rebound.  Musculoskeletal: Normal range of motion.  Neurological: He is alert and oriented to person, place, and time.  Skin: Skin is warm and dry. No rash noted.  Psychiatric: He has a normal mood and affect. His behavior is normal.    ED Course  Procedures (including critical care time) Labs Review Labs Reviewed - No data to display Imaging Review No results found.  EKG Interpretation   None       MDM   1. Asthma exacerbation    Patient given a 30 minute 10 mg nebulizer Benadryl treatment on reexam he is very trace end expiratory wheezing. No prolongation he states he feels "much much better". Plan will be a short observation period. Reexam. He is maintaining oxygenation and not starting to wheeze again I think he will be safe for discharge.    Roney Marion, MD 03/18/13 2238

## 2013-03-18 NOTE — ED Notes (Signed)
Paged RT at this time to start continuous ne, stated it will be a few minutes until she is able to come. Will continue to monitor pt for any signs of resp distress.

## 2013-03-19 MED FILL — Albuterol Sulfate Soln Nebu 0.5% (5 MG/ML): RESPIRATORY_TRACT | Qty: 2 | Status: AC

## 2014-03-26 ENCOUNTER — Emergency Department: Payer: Self-pay | Admitting: Emergency Medicine

## 2014-07-26 ENCOUNTER — Emergency Department (HOSPITAL_COMMUNITY)
Admission: EM | Admit: 2014-07-26 | Discharge: 2014-07-26 | Disposition: A | Discharge status: Home / Self Care | Payer: Self-pay | Attending: Emergency Medicine | Admitting: Emergency Medicine

## 2014-07-26 ENCOUNTER — Emergency Department (HOSPITAL_COMMUNITY): Payer: Self-pay

## 2014-07-26 ENCOUNTER — Encounter (HOSPITAL_COMMUNITY): Payer: Self-pay | Admitting: Emergency Medicine

## 2014-07-26 DIAGNOSIS — Z720 Tobacco use: Secondary | ICD-10-CM | POA: Insufficient documentation

## 2014-07-26 DIAGNOSIS — Z872 Personal history of diseases of the skin and subcutaneous tissue: Secondary | ICD-10-CM | POA: Insufficient documentation

## 2014-07-26 DIAGNOSIS — Z79899 Other long term (current) drug therapy: Secondary | ICD-10-CM | POA: Insufficient documentation

## 2014-07-26 DIAGNOSIS — Z7951 Long term (current) use of inhaled steroids: Secondary | ICD-10-CM | POA: Insufficient documentation

## 2014-07-26 DIAGNOSIS — J45901 Unspecified asthma with (acute) exacerbation: Secondary | ICD-10-CM | POA: Insufficient documentation

## 2014-07-26 MED ORDER — IPRATROPIUM-ALBUTEROL 0.5-2.5 (3) MG/3ML IN SOLN
3.0000 mL | Freq: Once | RESPIRATORY_TRACT | Status: AC
  Administered 2014-07-26: 3 mL via RESPIRATORY_TRACT
  Filled 2014-07-26: qty 3

## 2014-07-26 MED ORDER — PREDNISONE 20 MG PO TABS
60.0000 mg | ORAL_TABLET | Freq: Once | ORAL | Status: AC
  Administered 2014-07-26: 60 mg via ORAL
  Filled 2014-07-26: qty 3

## 2014-07-26 MED ORDER — ALBUTEROL SULFATE HFA 108 (90 BASE) MCG/ACT IN AERS
2.0000 | INHALATION_SPRAY | RESPIRATORY_TRACT | Status: DC | PRN
  Filled 2014-07-26: qty 6.7

## 2014-07-26 MED ORDER — PREDNISONE 20 MG PO TABS: ORAL_TABLET | ORAL | Status: AC

## 2014-07-26 NOTE — ED Notes (Signed)
Pt ambulate around ED with sats at 98-99% RA. Pt denies SOB, dizziness/lightheadedness. No audible wheezing noted. Pt stated, "I feel good".

## 2014-07-26 NOTE — ED Notes (Signed)
Pt presents with shob, pt states this is asthma flare up. Onset 4 hours ago, pt states he does not have MDI or nebs at home, has been taking allergy medication.

## 2014-07-26 NOTE — Discharge Instructions (Signed)

## 2014-07-26 NOTE — ED Notes (Signed)
Patient transported to X-ray and returned to room without distress noted. 

## 2014-07-26 NOTE — ED Provider Notes (Signed)
CSN: [id number]     Arrival date & time 07/26/14  1929 History   First MD Initiated Contact with Patient 07/26/14 2009     Chief Complaint  Patient presents with  . Asthma     (Consider location/radiation/quality/duration/timing/severity/associated sxs/prior Treatment) Patient is a 24 y.o. male presenting with asthma. The history is provided by the patient. No language interpreter was used.  Asthma This is a recurrent problem. The current episode started 12 to 24 hours ago. The problem occurs constantly. The problem has been gradually worsening. Associated symptoms include chest pain and shortness of breath. Pertinent negatives include no abdominal pain and no headaches. Exacerbated by: seasonal pollen exposure. Treatments tried: claritin. The treatment provided no relief.    Past Medical History  Diagnosis Date  . Asthma   . Eczema   . Asthma    History reviewed. No pertinent past surgical history. No family history on file. History  Substance Use Topics  . Smoking status: Former Smoker -- 1.00 packs/day for 5 years    Types: Cigarettes    Quit date: 12/04/2010  . Smokeless tobacco: Current User    Types: Snuff  . Alcohol Use: 3.6 oz/week    6 Cans of beer per week     Comment: daily    Review of Systems  Constitutional: Negative for fever, activity change, appetite change and fatigue.  HENT: Negative for congestion, facial swelling, rhinorrhea and trouble swallowing.   Eyes: Negative for photophobia and pain.  Respiratory: Positive for shortness of breath and wheezing. Negative for cough and chest tightness.   Cardiovascular: Positive for chest pain. Negative for leg swelling.  Gastrointestinal: Negative for nausea, vomiting, abdominal pain, diarrhea and constipation.  Endocrine: Negative for polydipsia and polyuria.  Genitourinary: Negative for dysuria, urgency, decreased urine volume and difficulty urinating.  Musculoskeletal: Negative for back pain and gait problem.   Skin: Negative for color change, rash and wound.  Allergic/Immunologic: Negative for immunocompromised state.  Neurological: Negative for dizziness, facial asymmetry, speech difficulty, weakness, numbness and headaches.  Psychiatric/Behavioral: Negative for confusion, decreased concentration and agitation.      Allergies  Review of patient's allergies indicates no known allergies.  Home Medications   Prior to Admission medications   Medication Sig Start Date End Date Taking? Authorizing Provider  dextromethorphan-guaiFENesin (MUCINEX DM) 30-600 MG per 12 hr tablet Take 1 tablet by mouth 2 (two) times daily as needed for cough (cough).   Yes Historical Provider, MD  loratadine (CLARITIN) 10 MG tablet Take 10 mg by mouth daily.   Yes Historical Provider, MD  albuterol (PROVENTIL HFA;VENTOLIN HFA) 108 (90 BASE) MCG/ACT inhaler Inhale 2 puffs into the lungs every 4 (four) hours as needed for wheezing or shortness of breath. Patient not taking: Reported on 07/26/2014 03/18/13   Rolland PorterMark James, MD  fluticasone (FLOVENT HFA) 220 MCG/ACT inhaler Inhale 1 puff into the lungs 2 (two) times daily. Patient not taking: Reported on 07/26/2014 03/18/13   Rolland PorterMark James, MD  predniSONE (DELTASONE) 20 MG tablet 3 tabs po day one, then 2 po daily x 4 days 07/26/14   Toy CookeyMegan Bexlee Bergdoll, MD   BP 138/66 mmHg  Pulse 82  Temp(Src) 98.2 F (36.8 C) (Oral)  Resp 18  Ht 6\' 1"  (1.854 m)  Wt 190 lb (86.183 kg)  BMI 25.07 kg/m2  SpO2 99% Physical Exam  Constitutional: He is oriented to person, place, and time. He appears well-developed and well-nourished. No distress.  HENT:  Head: Normocephalic and atraumatic.  Mouth/Throat: No oropharyngeal  exudate.  Eyes: Pupils are equal, round, and reactive to light.  Neck: Normal range of motion. Neck supple.  Cardiovascular: Normal rate, regular rhythm and normal heart sounds.  Exam reveals no gallop and no friction rub.   No murmur heard. Pulmonary/Chest: Effort normal. No  respiratory distress. He has decreased breath sounds in the right upper field, the right middle field, the right lower field, the left upper field, the left middle field and the left lower field. He has wheezes in the right upper field, the right middle field, the right lower field, the left upper field, the left middle field and the left lower field. He has no rales.  Abdominal: Soft. Bowel sounds are normal. He exhibits no distension and no mass. There is no tenderness. There is no rebound and no guarding.  Musculoskeletal: Normal range of motion. He exhibits no edema or tenderness.  Neurological: He is alert and oriented to person, place, and time.  Skin: Skin is warm and dry.  Psychiatric: He has a normal mood and affect.    ED Course  Procedures (including critical care time) Labs Review Labs Reviewed - No data to display  Imaging Review Dg Chest 2 View  07/26/2014   CLINICAL DATA:  Asthma attack today, shortness of breath and wheezing for 4 hours, productive cough. Prior smoker.  EXAM: CHEST  2 VIEW  COMPARISON:  Chest radiograph March 26, 2014  FINDINGS: Cardiomediastinal silhouette is unremarkable. The lungs are clear without pleural effusions or focal consolidations. Trachea projects midline and there is no pneumothorax. Soft tissue planes and included osseous structures are non-suspicious.  IMPRESSION: Normal chest.   Electronically Signed   By: Awilda Metro   On: 07/26/2014 21:54     EKG Interpretation None      MDM   Final diagnoses:  Asthma exacerbation    Pt is a 24 y.o. male with Pmhx as above who presents with 1 day of worsening shortness of breath, cough and wheezing.  She believes is due to seasonal allergies.  He has been taking Claritin at home, but has not had the money for inhalers.  He has had dry cough, some chest and back tightness with cough.  Said no fevers, chills or body aches.  Physical exam vital signs are stable.  She has prolonged expiratory  phase.  On pulmonary exam, he has decreased air movement with expiratory wheezing throughout.  Pt feeling improved after 2 duonebs, states tightness is gone, Still have diffuse wheezing, but improved air mvmt.   Pt continues to improved after 3 duonebs, ambulated in hall and maintained nml O12 sats.   Gigi Gin evaluation in the Emergency Department is complete. It has been determined that no acute conditions requiring further emergency intervention are present at this time. The patient/guardian have been advised of the diagnosis and plan. We have discussed signs and symptoms that warrant return to the ED, such as changes or worsening in symptoms, fever, wosening CP or SOB.       Toy Cookey, MD 07/26/14 2212

## 2014-07-26 NOTE — ED Notes (Addendum)
Awake. Verbally responsive. A/O x4. Resp even and unlabored. Auscultated exp wheezing with improvement in air exchange. No coughing noted at this time.  ABC's intact. SR on monitor. PA aware with new order noted.

## 2014-07-26 NOTE — ED Notes (Addendum)
Awake. Verbally responsive. A/O x4. Resp even and unlabored. No audible adventitious breath sounds noted. ABC's intact. SR on monitor. Continue to auscultated exp wheezing bil lower lobes. Pt reported feeling better after breathing tx. Sats at 99% RA.

## 2014-07-26 NOTE — ED Notes (Signed)
Patient is alert and oriented x3.  He was given DC instructions and follow up visit instructions.  Patient gave verbal understanding.  He was DC ambulatory under his own power to home.  V/S stable.  He was not showing any signs of distress on DC 

## 2014-07-26 NOTE — ED Notes (Signed)
Awake. Verbally responsive. A/O x4. Resp even and labored. Audible exp wheezing breath sounds noted. Auscultated diminish breath sounds bil and throughout. Occ productive cough of clear sputum. ABC's intact.

## 2014-07-26 NOTE — ED Notes (Signed)
Patient transported to X-ray 

## 2015-06-19 IMAGING — CR DG CHEST 2V
2 series · 2 of 2 positions shown · non-contrast
Comparison: Chest radiograph March 26, 2014

CLINICAL DATA: Asthma attack today, shortness of breath and
wheezing for 4 hours, productive cough. Prior smoker.

EXAM:
CHEST  2 VIEW

[w chest pa]
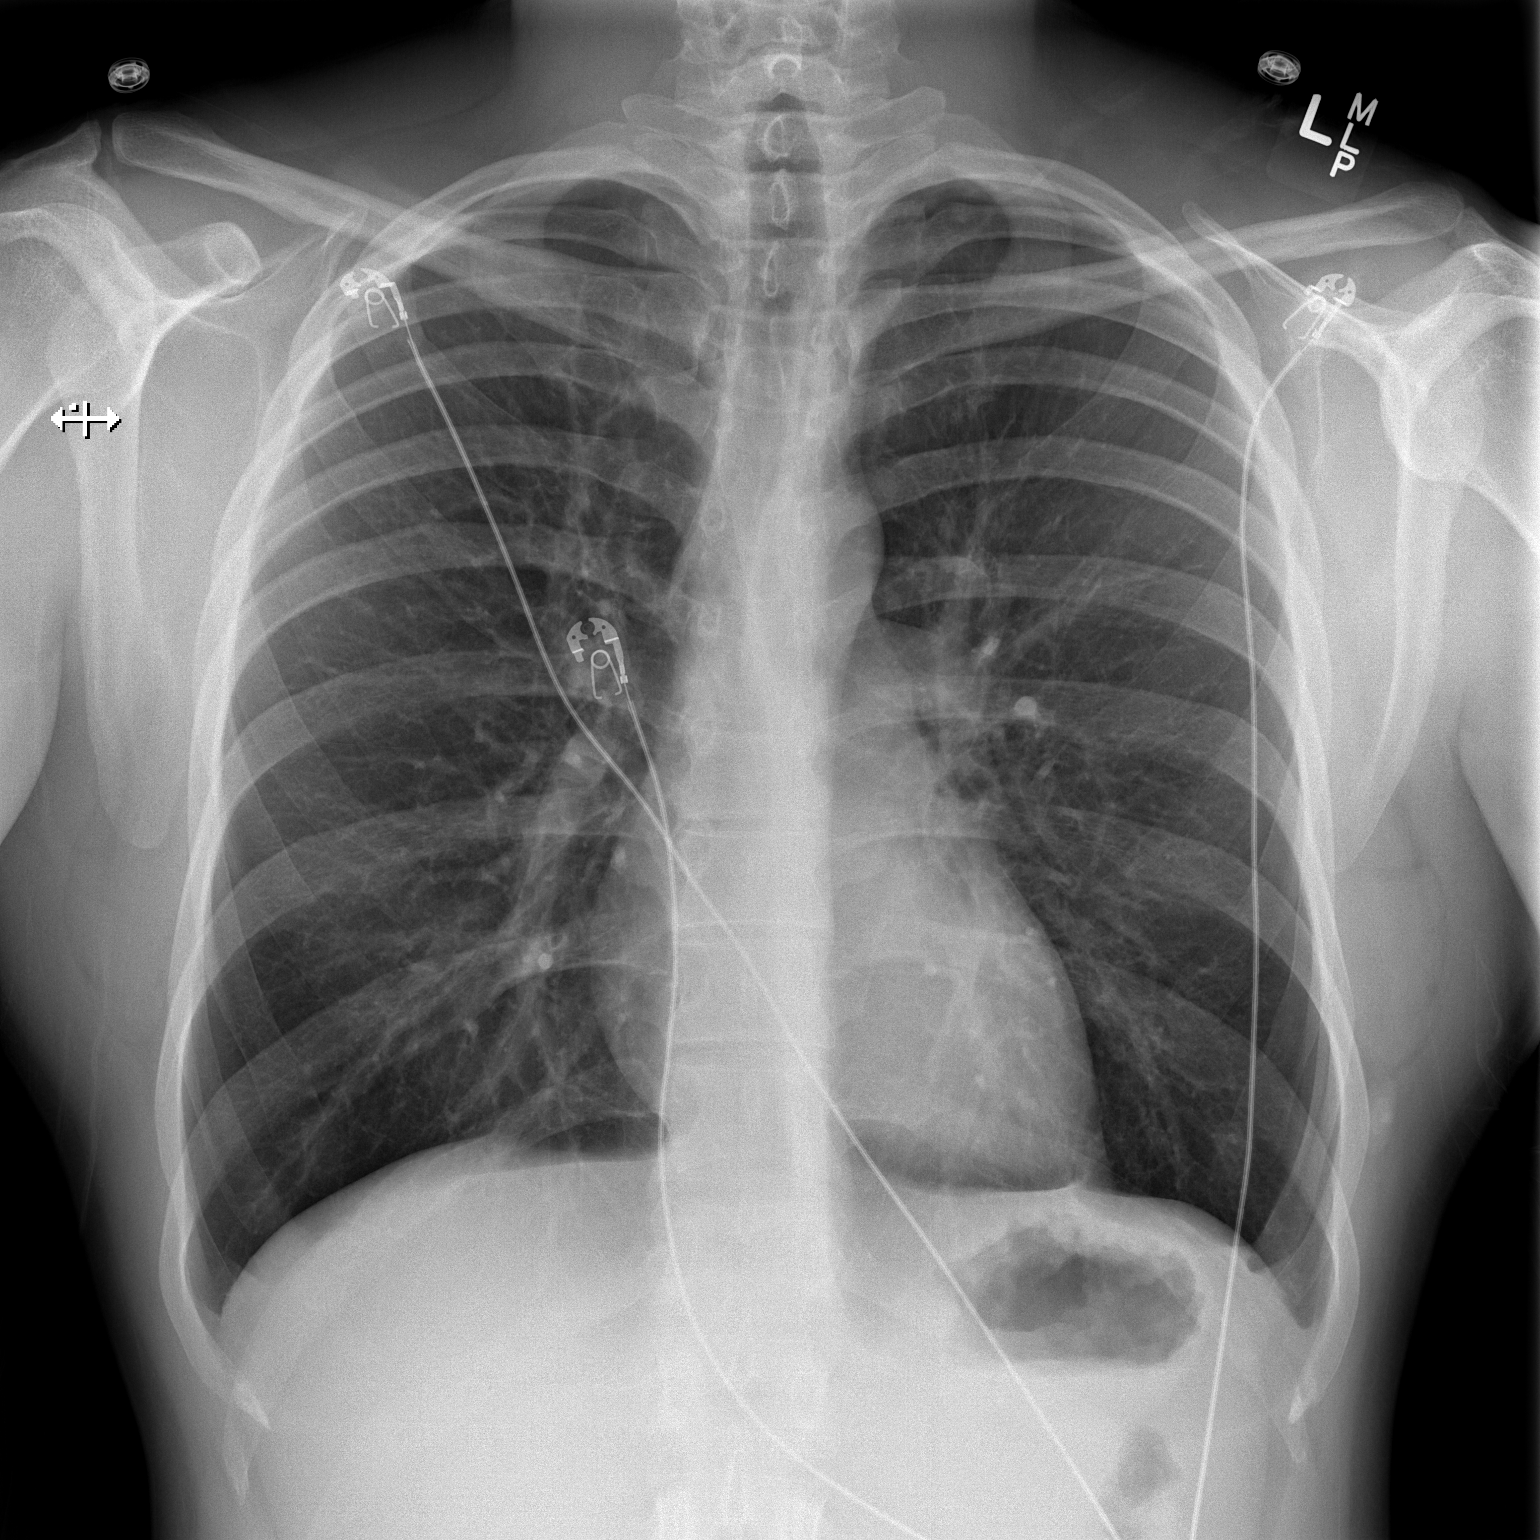

[w chest lat]
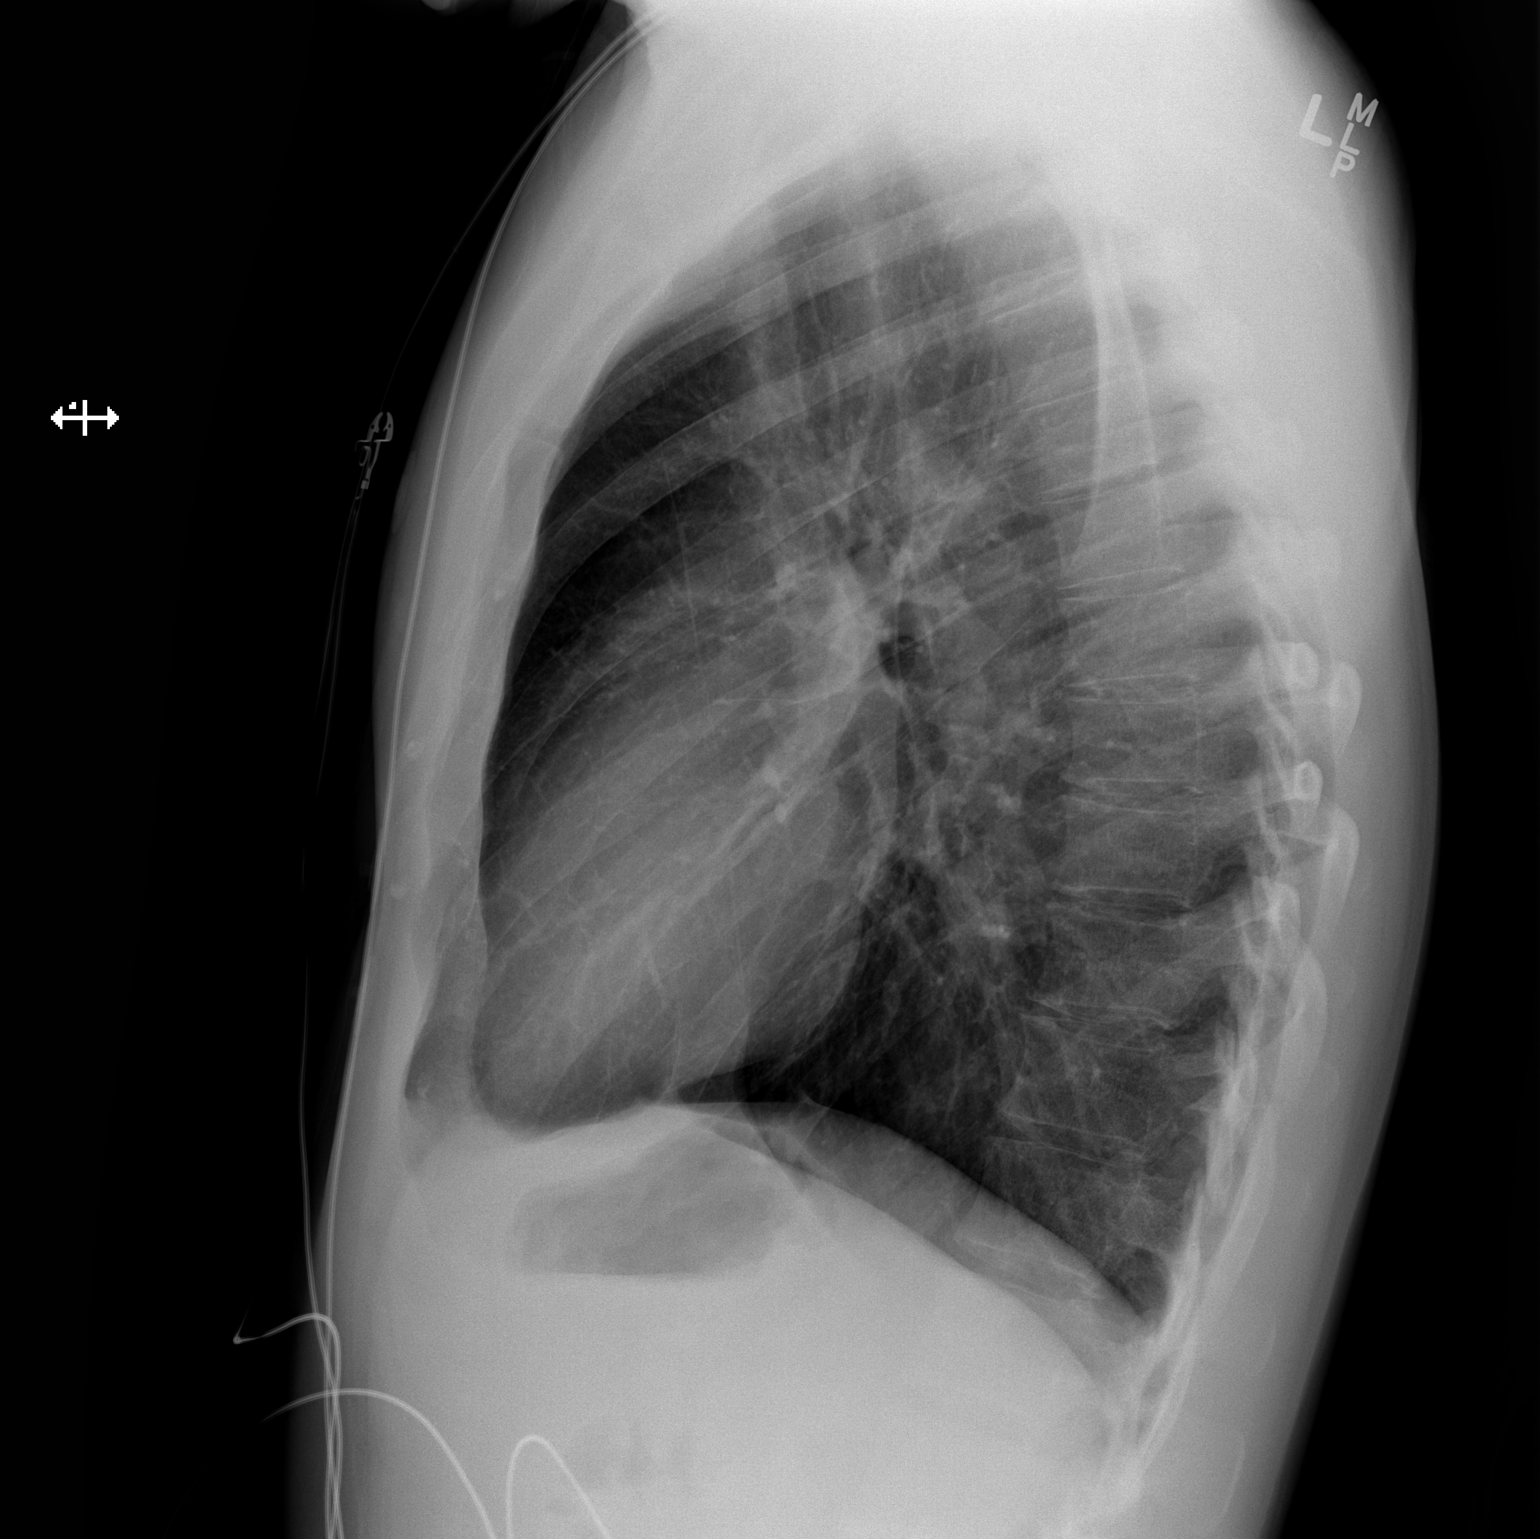

[2 of 2 positions shown; findings below may reference images not displayed]

FINDINGS: Cardiomediastinal silhouette is unremarkable. The lungs are clear
without pleural effusions or focal consolidations. Trachea projects
midline and there is no pneumothorax. Soft tissue planes and
included osseous structures are non-suspicious.
IMPRESSION: Normal chest.

By: Elbaiti Nenden

## 2018-05-19 ENCOUNTER — Encounter (HOSPITAL_COMMUNITY): Payer: Self-pay | Admitting: Emergency Medicine

## 2018-05-19 ENCOUNTER — Emergency Department (HOSPITAL_COMMUNITY): Admission: EM | Admit: 2018-05-19 | Discharge: 2018-05-19 | Discharge status: Absent without leave | Payer: Self-pay

## 2018-05-19 ENCOUNTER — Other Ambulatory Visit: Payer: Self-pay

## 2018-05-19 NOTE — ED Triage Notes (Signed)
Pt. Stated, I was playing knocker ball and hit a guy head on andI think I have a concussion.. I have a stiff neck , No LOC.
